# Patient Record
Sex: Male | Born: 2000 | Race: White | Hispanic: Yes | Marital: Single | Smoking: Never smoker
Health system: Southern US, Community
[De-identification: ages and names within clinical notes are randomized; demographics above are authoritative.]

## PROBLEM LIST (undated history)

## (undated) DIAGNOSIS — K2960 Other gastritis without bleeding: Secondary | ICD-10-CM

---
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
## 2019-12-21 ENCOUNTER — Ambulatory Visit (HOSPITAL_COMMUNITY)
Admission: EM | Admit: 2019-12-21 | Discharge: 2019-12-21 | Disposition: A | Discharge status: Home / Self Care | Payer: Medicaid Other | Attending: Family Medicine | Admitting: Family Medicine

## 2019-12-21 ENCOUNTER — Ambulatory Visit (INDEPENDENT_AMBULATORY_CARE_PROVIDER_SITE_OTHER): Payer: Medicaid Other

## 2019-12-21 ENCOUNTER — Other Ambulatory Visit: Payer: Self-pay

## 2019-12-21 ENCOUNTER — Encounter (HOSPITAL_COMMUNITY): Payer: Self-pay

## 2019-12-21 DIAGNOSIS — S2242XA Multiple fractures of ribs, left side, initial encounter for closed fracture: Secondary | ICD-10-CM | POA: Diagnosis not present

## 2019-12-21 DIAGNOSIS — S299XXA Unspecified injury of thorax, initial encounter: Secondary | ICD-10-CM | POA: Diagnosis not present

## 2019-12-21 DIAGNOSIS — R0781 Pleurodynia: Secondary | ICD-10-CM | POA: Diagnosis not present

## 2019-12-21 MED ORDER — TRAMADOL HCL 50 MG PO TABS: 50.0000 mg | ORAL_TABLET | Freq: Four times a day (QID) | ORAL | 0 refills | Status: AC | PRN

## 2019-12-21 NOTE — ED Provider Notes (Signed)
[facility]    CSN: [id number] Arrival date & time: 12/21/19  1504      History   Chief Complaint Chief Complaint  Patient presents with  . Fall    HPI Kevin Rodriguez is a 19 y.o. male.   Presenting today with left anterior rib pain and pain with breathing that has worsened the past week after a mechanical fall onto concrete. Denies bruising or abrasions to the area, wheezing, SOB, swelling to area. Has not tried anything OTC for sxs.      Past Medical History:  Diagnosis Date  . Reflux gastritis     There are no problems to display for this patient.   History reviewed. No pertinent surgical history.     Home Medications    Prior to Admission medications   Medication Sig Start Date End Date Taking? Authorizing Provider  ibuprofen (ADVIL,MOTRIN) 600 MG tablet Take 1 tablet (600 mg total) by mouth every 8 (eight) hours as needed. 11/03/17   Cathie Hoops, Amy V, PA-C  omeprazole (PRILOSEC) 40 MG capsule Take 40 mg by mouth daily.    [provider]  ondansetron (ZOFRAN) 4 MG tablet Take 1 tablet (4 mg total) by mouth every 6 (six) hours. Patient not taking: Reported on 11/03/2017 11/09/15   Alene Mires, NP  ranitidine (ZANTAC) 150 MG capsule Take 150 mg by mouth 2 (two) times daily.    [provider]  traMADol (ULTRAM) 50 MG tablet Take 1 tablet (50 mg total) by mouth every 6 (six) hours as needed. 12/21/19   Particia Nearing, PA-C    Family History Family History  Problem Relation Age of Onset  . Healthy Mother   . Healthy Father     Social History Social History   Tobacco Use  . Smoking status: Never Smoker  . Smokeless tobacco: Never Used  Substance Use Topics  . Alcohol use: No  . Drug use: No     Allergies   Patient has no known allergies.   Review of Systems Review of Systems PER HPI    Physical Exam Triage Vital Signs ED Triage Vitals  Enc Vitals Group     BP 12/21/19 1518 128/73     Pulse  Rate 12/21/19 1518 75     Resp 12/21/19 1518 18     Temp 12/21/19 1518 99.6 F (37.6 C)     Temp src --      SpO2 12/21/19 1518 100 %     Weight --      Height --      Head Circumference --      Peak Flow --      Pain Score 12/21/19 1517 5     Pain Loc --      Pain Edu? --      Excl. in GC? --    No data found.  Updated Vital Signs BP 128/73   Pulse 75   Temp 99.6 F (37.6 C)   Resp 18   SpO2 100%   Visual Acuity Right Eye Distance:   Left Eye Distance:   Bilateral Distance:    Right Eye Near:   Left Eye Near:    Bilateral Near:     Physical Exam Vitals and nursing note reviewed.  Constitutional:      Appearance: Normal appearance.  HENT:     Head: Atraumatic.  Eyes:     Extraocular Movements: Extraocular movements intact.     Conjunctiva/sclera: Conjunctivae normal.  Cardiovascular:     Rate and Rhythm: Normal rate and regular rhythm.  Pulmonary:     Effort: Pulmonary effort is normal.     Breath sounds: Normal breath sounds. No wheezing or rales.     Comments: Chest rise symmetric, good breath sounds throughout Abdominal:     General: Bowel sounds are normal. There is no distension.     Palpations: Abdomen is soft.     Tenderness: There is no abdominal tenderness. There is no guarding.  Musculoskeletal:        General: Tenderness (left anterior ribs ttp ) present. No swelling. Normal range of motion.     Cervical back: Normal range of motion and neck supple.  Skin:    General: Skin is warm and dry.     Findings: No bruising, erythema or lesion.  Neurological:     General: No focal deficit present.     Mental Status: He is oriented to person, place, and time.  Psychiatric:        Mood and Affect: Mood normal.        Thought Content: Thought content normal.        Judgment: Judgment normal.     UC Treatments / Results  Labs (all labs ordered are listed, but only abnormal results are displayed) Labs Reviewed - No data to  display  EKG   Radiology DG Ribs Unilateral W/Chest Left  Result Date: 12/21/2019 CLINICAL DATA:  Left anterior rib pain after fall last week EXAM: LEFT RIBS AND CHEST - 3+ VIEW COMPARISON:  None. FINDINGS: Minimally displaced and angulated fracture of the anterolateral left sixth rib. Slight cortical angulation in subtle lucency likely reflecting a nondisplaced fracture of the adjacent anterolateral left seventh rib as well. Some mild adjacent soft tissue stranding. No other discernible rib fractures are seen. No pneumothorax or visible pleural effusion. No consolidation or edema. The cardiomediastinal contours are unremarkable. Remaining soft tissues are unremarkable IMPRESSION: 1. Minimally displaced fracture of the anterolateral left sixth rib. 2. Probable nondisplaced fracture of the adjacent anterolateral left seventh rib as well. 3. No associated pneumothorax or pleural fluid. Electronically Signed   By: Kreg Shropshire M.D.   On: 12/21/2019 16:14    Procedures Procedures (including critical care time)  Medications Ordered in UC Medications - No data to display  Initial Impression / Assessment and Plan / UC Course  I have reviewed the triage vital signs and the nursing notes.  Pertinent labs & imaging results that were available during my care of the patient were reviewed by me and considered in my medical decision making (see chart for details).      X-ray today showing mildly displaced left 6th rib fracture and possible non displaced 7th rib fracture with no evidence of pneumothorax. Vitals stable, breath sounds full and equal b/l. Will tx with tramadol for severe pain, OTC pain relievers, heat, rest. F/u if sxs worsen at any time.   Final Clinical Impressions(s) / UC Diagnoses   Final diagnoses:  Closed fracture of multiple ribs of left side, initial encounter   Discharge Instructions   None    ED Prescriptions    Medication Sig Dispense Auth. Provider   traMADol (ULTRAM)  50 MG tablet Take 1 tablet (50 mg total) by mouth every 6 (six) hours as needed. 15 tablet Particia Nearing, [place]     I have reviewed the PDMP during this encounter.   Particia Nearing, [place] 12/21/19 1630

## 2019-12-21 NOTE — ED Triage Notes (Signed)
Pt presents with complaints of slipping and falling last week. Complaints of pain in his left rib area. Denies using any otc pain medication at home.

## 2020-05-16 ENCOUNTER — Ambulatory Visit (HOSPITAL_COMMUNITY)
Admission: EM | Admit: 2020-05-16 | Discharge: 2020-05-16 | Disposition: A | Discharge status: Home / Self Care | Payer: Medicaid Other | Attending: Family Medicine | Admitting: Family Medicine

## 2020-05-16 ENCOUNTER — Emergency Department (HOSPITAL_COMMUNITY)
Admission: EM | Admit: 2020-05-16 | Discharge: 2020-05-16 | Disposition: A | Discharge status: Home / Self Care | Payer: Medicaid Other | Attending: Emergency Medicine | Admitting: Emergency Medicine

## 2020-05-16 ENCOUNTER — Emergency Department (HOSPITAL_COMMUNITY): Payer: Medicaid Other

## 2020-05-16 ENCOUNTER — Other Ambulatory Visit: Payer: Self-pay

## 2020-05-16 ENCOUNTER — Encounter (HOSPITAL_COMMUNITY): Payer: Self-pay | Admitting: Emergency Medicine

## 2020-05-16 DIAGNOSIS — R200 Anesthesia of skin: Secondary | ICD-10-CM | POA: Insufficient documentation

## 2020-05-16 DIAGNOSIS — S060X0A Concussion without loss of consciousness, initial encounter: Secondary | ICD-10-CM | POA: Insufficient documentation

## 2020-05-16 DIAGNOSIS — Y9311 Activity, swimming: Secondary | ICD-10-CM | POA: Diagnosis not present

## 2020-05-16 DIAGNOSIS — S0990XA Unspecified injury of head, initial encounter: Secondary | ICD-10-CM | POA: Diagnosis present

## 2020-05-16 DIAGNOSIS — W228XXA Striking against or struck by other objects, initial encounter: Secondary | ICD-10-CM | POA: Diagnosis not present

## 2020-05-16 DIAGNOSIS — Y9234 Swimming pool (public) as the place of occurrence of the external cause: Secondary | ICD-10-CM | POA: Diagnosis not present

## 2020-05-16 LAB — CBC WITH DIFFERENTIAL/PLATELET
Abs Immature Granulocytes: 0.02 10*3/uL (ref 0.00–0.07)
Basophils Absolute: 0.1 10*3/uL (ref 0.0–0.1)
Basophils Relative: 1 %
Eosinophils Absolute: 0.3 10*3/uL (ref 0.0–0.5)
Eosinophils Relative: 3 %
HCT: 42.7 % (ref 39.0–52.0)
Hemoglobin: 14.4 g/dL (ref 13.0–17.0)
Immature Granulocytes: 0 %
Lymphocytes Relative: 34 %
Lymphs Abs: 2.6 10*3/uL (ref 0.7–4.0)
MCH: 29.5 pg (ref 26.0–34.0)
MCHC: 33.7 g/dL (ref 30.0–36.0)
MCV: 87.5 fL (ref 80.0–100.0)
Monocytes Absolute: 0.8 10*3/uL (ref 0.1–1.0)
Monocytes Relative: 10 %
Neutro Abs: 3.9 10*3/uL (ref 1.7–7.7)
Neutrophils Relative %: 52 %
Platelets: 222 10*3/uL (ref 150–400)
RBC: 4.88 MIL/uL (ref 4.22–5.81)
RDW: 12 % (ref 11.5–15.5)
WBC: 7.6 10*3/uL (ref 4.0–10.5)
nRBC: 0 % (ref 0.0–0.2)

## 2020-05-16 LAB — BASIC METABOLIC PANEL
Anion gap: 7 (ref 5–15)
BUN: 10 mg/dL (ref 6–20)
CO2: 25 mmol/L (ref 22–32)
Calcium: 9.1 mg/dL (ref 8.9–10.3)
Chloride: 104 mmol/L (ref 98–111)
Creatinine, Ser: 0.71 mg/dL (ref 0.61–1.24)
GFR, Estimated: >60 mL/min (ref >60)
Glucose, Bld: 105 mg/dL — ABNORMAL HIGH (ref 70–99)
Potassium: 3.6 mmol/L (ref 3.5–5.1)
Sodium: 136 mmol/L (ref 135–145)

## 2020-05-16 NOTE — Discharge Instructions (Signed)
Use tylenol or ibuprofen for the headache.  Drink plenty of water and get lots of rest
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
## 2020-05-16 NOTE — ED Notes (Signed)
Patient verbalizes understanding of discharge instructions. Opportunity for questioning and answers were provided. Armband removed by staff, pt discharged from ED ambulatory.   

## 2020-05-21 NOTE — ED Provider Notes (Signed)
  [facility]   [id number] 05/16/20 Arrival Time: 1928  ASSESSMENT & PLAN:  1. Numbness     Discussed my limited ability to work this up here. He prefers ED evaluation. Stable upon discharge.   Follow-up Information    Go to  [facility] EMERGENCY DEPARTMENT.   Specialty: Emergency Medicine Contact information: [address] [id number] [place] [address] [phone]              Reviewed expectations re: course of current medical issues. Questions answered. Outlined signs and symptoms indicating need for more acute intervention. Patient verbalized understanding. After Visit Summary given.   SUBJECTIVE: History from: patient. Fair historian. Kevin Rodriguez is a 20 y.o. male who reports "body numbness". "My whole body". Occasional headaches and questionable visual changes. Denies alcohol and tobacco and drug use. Ambulatory without difficulty. No head injury. No new medications.    OBJECTIVE:  Vitals:   05/16/20 2006  BP: 120/72  Pulse: 73  Resp: 20  Temp: 99.1 F (37.3 C)  TempSrc: Oral  SpO2: 98%    General appearance: alert; no distress Eyes: PERRLA; EOMI; conjunctiva normal HENT: normocephalic; atraumatic; TMs normal; nasal mucosa normal; oral mucosa normal Neck: supple  Lungs: clear to auscultation bilaterally; unlabored Heart: regular rate and rhythm without murmer Abdomen: soft, non-tender; bowel sounds normal; no masses or organomegaly; no guarding or rebound tenderness Back: no CVA tenderness Extremities: no edema; symmetrical with no gross deformities Skin: warm and dry Neurologic: normal gait; normal symmetric reflexes; CN 2-12 grossly intact Psychological: alert and cooperative; normal mood and affect   No Known Allergies  Past Medical History:  Diagnosis Date  . Reflux gastritis    Social History   Socioeconomic History  . Marital status: Single    Spouse name: Not on file   . Number of children: Not on file  . Years of education: Not on file  . Highest education level: Not on file  Occupational History  . Not on file  Tobacco Use  . Smoking status: Never Smoker  . Smokeless tobacco: Never Used  Vaping Use  . Vaping Use: Never used  Substance and Sexual Activity  . Alcohol use: No  . Drug use: No  . Sexual activity: Not on file  Other Topics Concern  . Not on file  Social History Narrative  . Not on file   Social Determinants of Health   Financial Resource Strain: Not on file  Food Insecurity: Not on file  Transportation Needs: Not on file  Physical Activity: Not on file  Stress: Not on file  Social Connections: Not on file  Intimate Partner Violence: Not on file   Family History  Problem Relation Age of Onset  . Healthy Mother   . Healthy Father    History reviewed. No pertinent surgical history.   Mardella Layman, MD 05/21/20 [phone]

## 2022-07-12 IMAGING — CT CT HEAD W/O CM
4 series · 17 of 47 positions shown, 19 images · non-contrast
Comparison: None.

CLINICAL DATA: Feeling of numbness throughout the body for a week

EXAM:
CT HEAD WITHOUT CONTRAST
TECHNIQUE: Contiguous axial images were obtained from the base of the skull
through the vertex without intravenous contrast.

[Series 3: head wo · axial · 0.40mm/px · z∈[+988,+1108]mm · 7 of 32 slices shown, 9 images]
[im 4/32  brain]
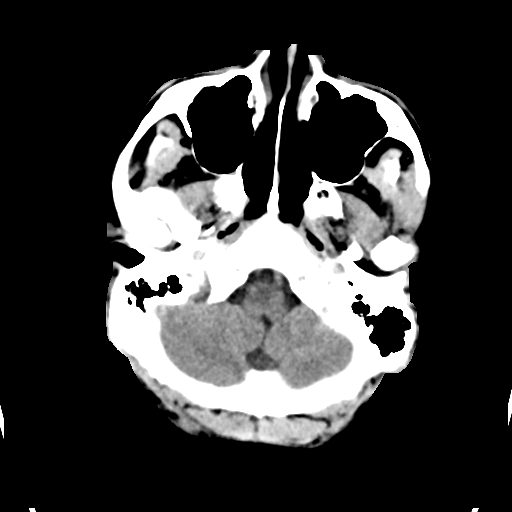
[im 4/32  bone]
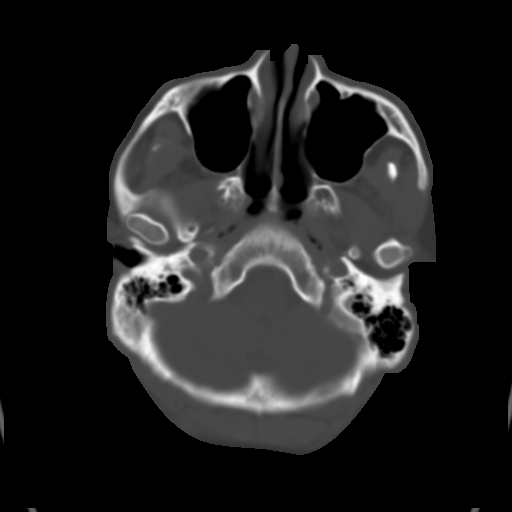
[im 8/32  brain]
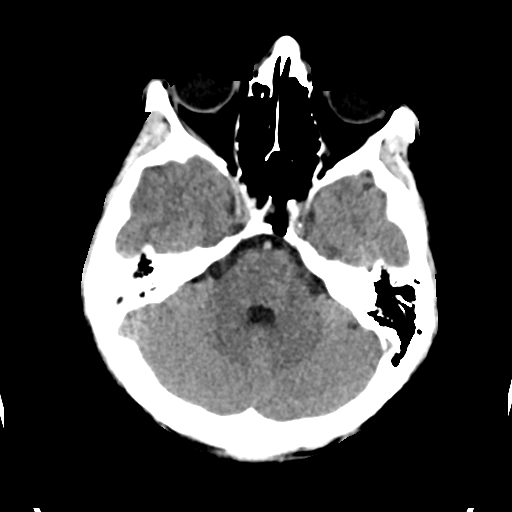
[im 12/32  brain]
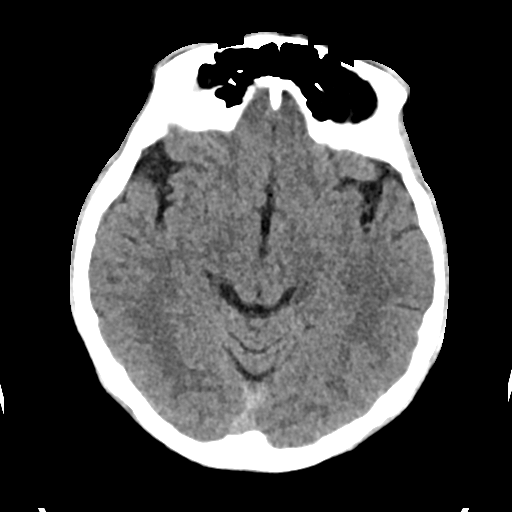
[im 16/32  brain]
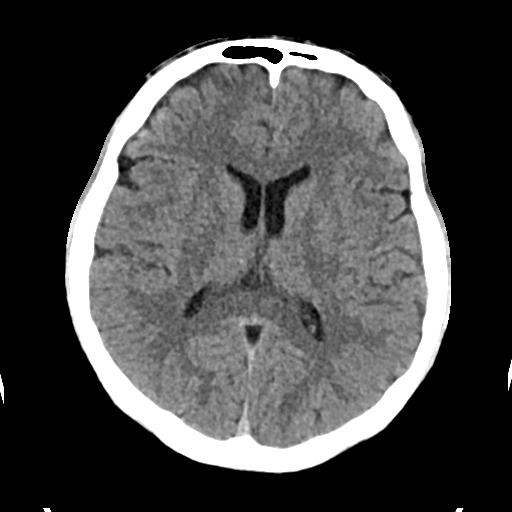
[im 20/32  brain]
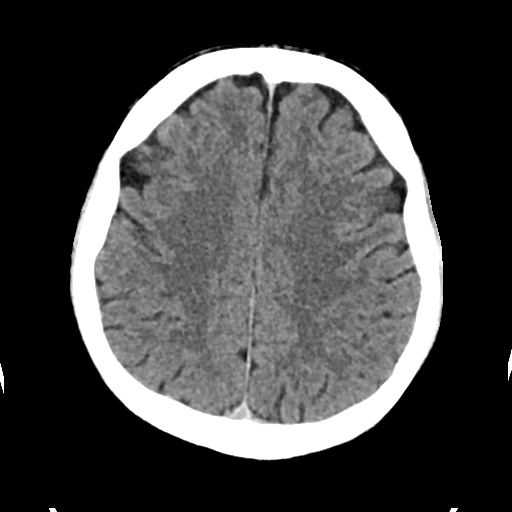
[im 20/32  bone]
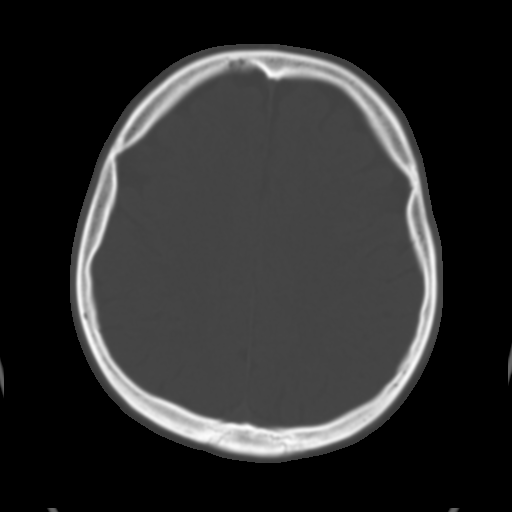
[im 24/32  brain]
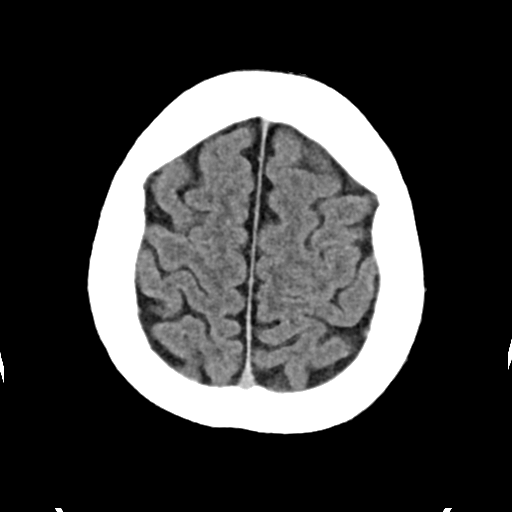
[im 28/32  brain]
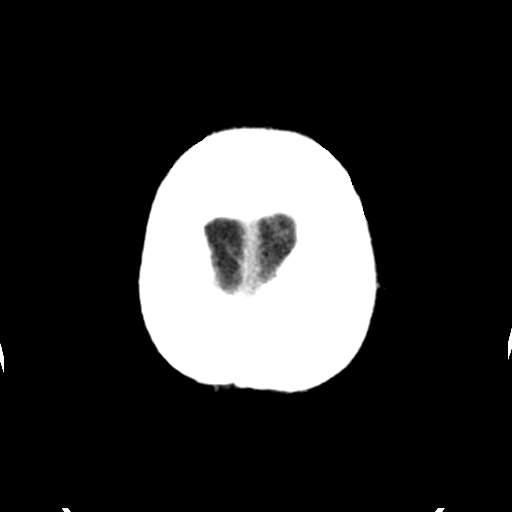

[Series 4: head bone · axial · 0.40mm/px · z∈[+988,+1044]mm · 4 of 80 slices shown]
[im 8/80  bone]
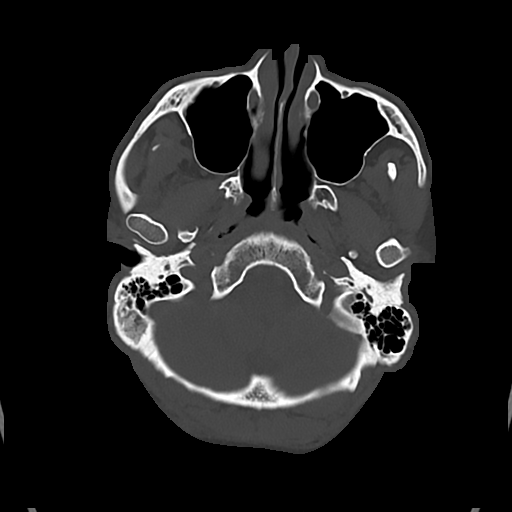
[im 16/80  bone]
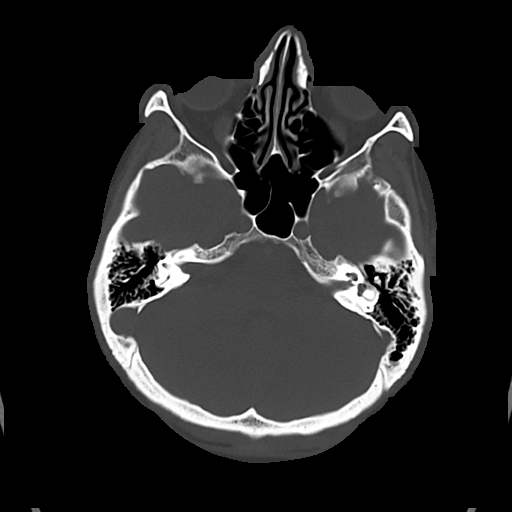
[im 24/80  bone]
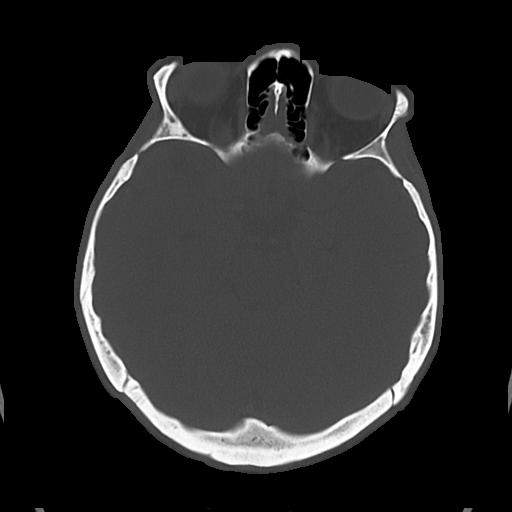
[im 36/80  bone]
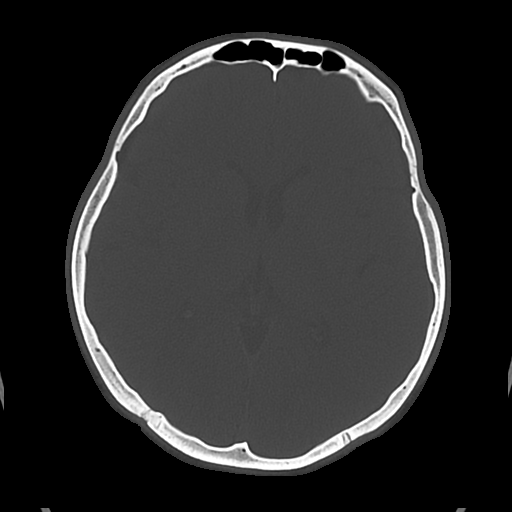

[Series 5: cor soft · coronal · 0.32mm/px · 3 of 66 slices shown]
[im 22/66  brain]
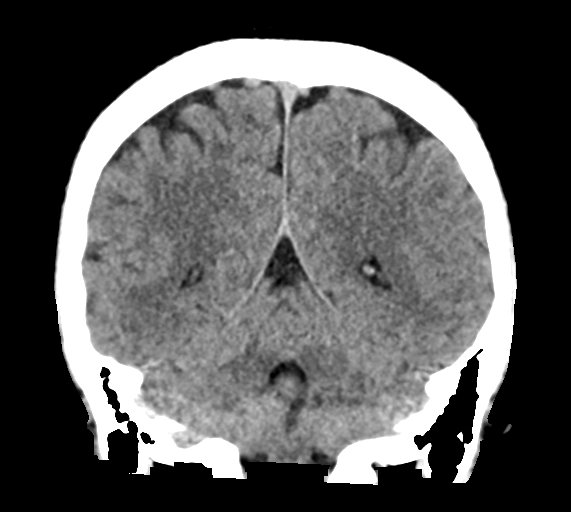
[im 29/66  brain]
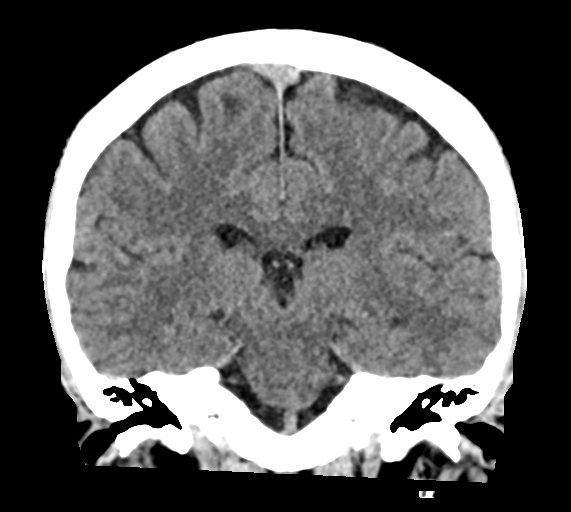
[im 37/66  brain]
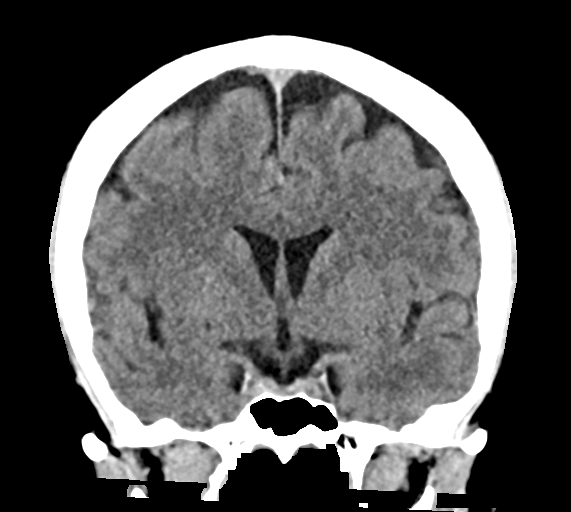

[Series 6: sag soft · sagittal · 0.32mm/px · 3 of 62 slices shown]
[im 21/62  brain]
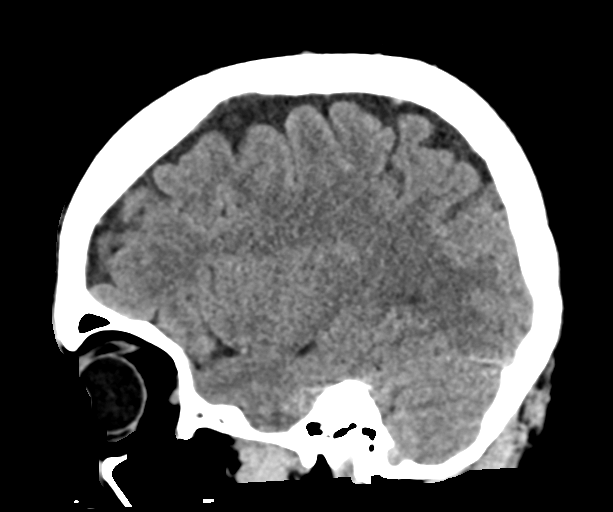
[im 31/62  brain]
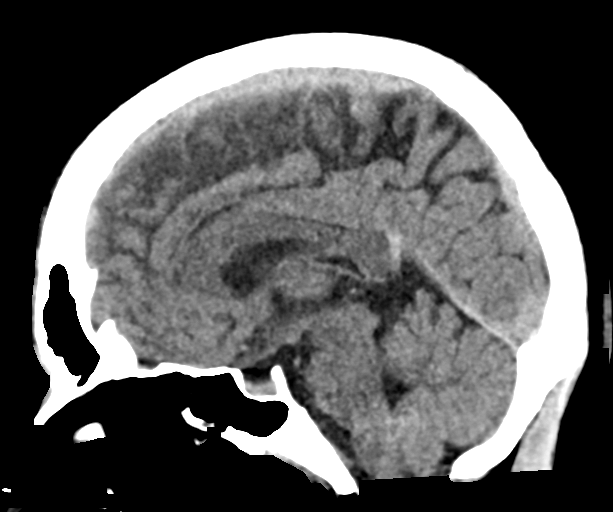
[im 41/62  brain]
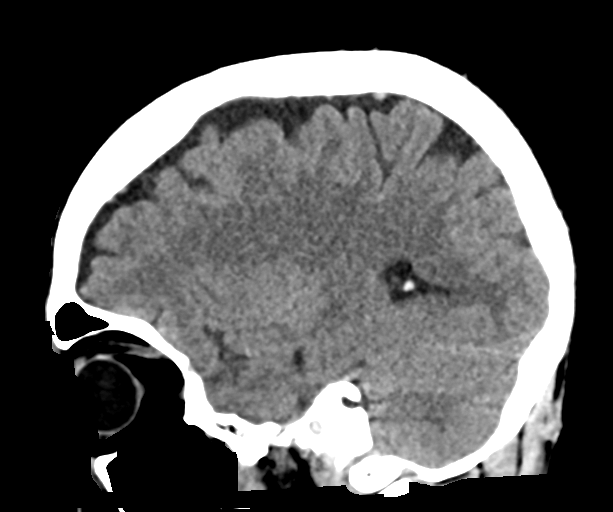

[17 of 47 positions shown; findings below may reference images not displayed]

FINDINGS: Brain: No evidence of acute infarction, hemorrhage, hydrocephalus,
extra-axial collection or mass lesion/mass effect.

Vascular: No hyperdense vessel or unexpected calcification.

Skull: Normal. Negative for fracture or focal lesion.

Sinuses/Orbits: The visualized paranasal sinuses and mastoid air
cells are predominantly clear. Orbits are grossly unremarkable.

Other: None.
IMPRESSION: No acute intracranial abnormality.

## 2023-05-20 ENCOUNTER — Other Ambulatory Visit: Payer: Self-pay

## 2023-05-20 DIAGNOSIS — J029 Acute pharyngitis, unspecified: Secondary | ICD-10-CM | POA: Diagnosis present

## 2023-05-20 DIAGNOSIS — J028 Acute pharyngitis due to other specified organisms: Secondary | ICD-10-CM | POA: Insufficient documentation

## 2023-05-20 DIAGNOSIS — R059 Cough, unspecified: Secondary | ICD-10-CM | POA: Insufficient documentation

## 2023-05-20 DIAGNOSIS — B9789 Other viral agents as the cause of diseases classified elsewhere: Secondary | ICD-10-CM | POA: Diagnosis not present

## 2023-05-20 LAB — RESP PANEL BY RT-PCR (RSV, FLU A&B, COVID)  RVPGX2
Influenza A by PCR: NEGATIVE
Influenza B by PCR: NEGATIVE
Resp Syncytial Virus by PCR: NEGATIVE
SARS Coronavirus 2 by RT PCR: NEGATIVE

## 2023-05-20 LAB — GROUP A STREP BY PCR: Group A Strep by PCR: NOT DETECTED

## 2023-05-20 NOTE — ED Triage Notes (Signed)
 Pt POV reporting sharp pain in throat and chills, last took tylenol 8pm. Denies congestion, CP, SOB.

## 2023-05-21 ENCOUNTER — Emergency Department (HOSPITAL_BASED_OUTPATIENT_CLINIC_OR_DEPARTMENT_OTHER)
Admission: EM | Admit: 2023-05-21 | Discharge: 2023-05-21 | Disposition: A | Discharge status: Home / Self Care | Attending: Emergency Medicine | Admitting: Emergency Medicine

## 2023-05-21 DIAGNOSIS — J029 Acute pharyngitis, unspecified: Secondary | ICD-10-CM

## 2023-05-21 MED ORDER — IBUPROFEN 400 MG PO TABS
600.0000 mg | ORAL_TABLET | Freq: Once | ORAL | Status: AC
  Administered 2023-05-21: 600 mg via ORAL
  Filled 2023-05-21: qty 1

## 2023-05-21 MED ORDER — DEXAMETHASONE SODIUM PHOSPHATE 10 MG/ML IJ SOLN
10.0000 mg | Freq: Once | INTRAMUSCULAR | Status: AC
  Administered 2023-05-21: 10 mg via INTRAMUSCULAR
  Filled 2023-05-21: qty 1

## 2023-05-21 NOTE — ED Provider Notes (Signed)
 [place] EMERGENCY DEPARTMENT AT [facility] Provider Note   CSN: [id number] Arrival date & time: 05/20/23  2221     History  Chief Complaint  Patient presents with   Sore Throat    Kevin Rodriguez is a 23 y.o. male.  HPI     This is a 23 year old male who presents with sore throat.  Patient reports 1 day history of sore throat.  Also states that he feels chilled.  Has not had any documented fevers.  Had a cough last week.  No other known sick contacts.  Took Tylenol prior to arrival.  Home Medications Prior to Admission medications   Medication Sig Start Date End Date Taking? Authorizing Provider  ibuprofen  (ADVIL ,MOTRIN ) 600 MG tablet Take 1 tablet (600 mg total) by mouth every 8 (eight) hours as needed. 11/03/17   Wilhelmenia Harada, Amy V, PA-C  omeprazole (PRILOSEC) 40 MG capsule Take 40 mg by mouth daily.    [provider]  ondansetron  (ZOFRAN ) 4 MG tablet Take 1 tablet (4 mg total) by mouth every 6 (six) hours. Patient not taking: Reported on 11/03/2017 11/09/15   Marceil Sensor, NP  ranitidine (ZANTAC) 150 MG capsule Take 150 mg by mouth 2 (two) times daily.    [provider]  traMADol  (ULTRAM ) 50 MG tablet Take 1 tablet (50 mg total) by mouth every 6 (six) hours as needed. 12/21/19   Corbin Dess, PA-C      Allergies    Patient has no known allergies.    Review of Systems   Review of Systems  Constitutional:  Positive for chills. Negative for fever.  HENT:  Positive for sore throat.   Respiratory:  Positive for cough.   All other systems reviewed and are negative.   Physical Exam Updated Vital Signs BP 122/83   Pulse 95   Temp 99.2 F (37.3 C) (Oral)   Resp 16   Ht 1.702 m (5\' 7" )   Wt 74.8 kg   SpO2 98%   BMI 25.84 kg/m  Physical Exam Vitals and nursing note reviewed.  Constitutional:      Appearance: He is well-developed.  HENT:     Head: Normocephalic and atraumatic.     Nose: No congestion.      Mouth/Throat:     Comments: Posterior oropharynx slightly erythematous, uvula midline, slight tonsillar exudate noted bilaterally, no asymmetric tonsillar swelling Eyes:     Pupils: Pupils are equal, round, and reactive to light.  Cardiovascular:     Rate and Rhythm: Normal rate and regular rhythm.     Heart sounds: Normal heart sounds. No murmur heard. Pulmonary:     Effort: Pulmonary effort is normal. No respiratory distress.     Breath sounds: Normal breath sounds. No wheezing.  Abdominal:     General: Bowel sounds are normal.     Palpations: Abdomen is soft.     Tenderness: There is no abdominal tenderness. There is no rebound.  Musculoskeletal:     Cervical back: Neck supple.  Lymphadenopathy:     Cervical: Cervical adenopathy present.  Skin:    General: Skin is warm and dry.  Neurological:     Mental Status: He is alert and oriented to person, place, and time.  Psychiatric:        Mood and Affect: Mood normal.     ED Results / Procedures / Treatments   Labs (all labs ordered are listed, but only abnormal results are displayed) Labs Reviewed  GROUP  A STREP BY PCR  RESP PANEL BY RT-PCR (RSV, FLU A&B, COVID)  [id number]    EKG None  Radiology No results found.  Procedures Procedures    Medications Ordered in ED Medications  dexamethasone  (DECADRON ) injection 10 mg (10 mg Intramuscular Given 05/21/23 0128)  ibuprofen  (ADVIL ) tablet 600 mg (600 mg Oral Given 05/21/23 0128)    ED Course/ Medical Decision Making/ A&P                                 Medical Decision Making Risk Prescription drug management.   This patient presents to the ED for concern of sore throat, this involves an extensive number of treatment options, and is a complaint that carries with it a high risk of complications and morbidity.  I considered the following differential and admission for this acute, potentially life threatening condition.  The differential diagnosis includes strep, COVID,  influenza, viral pharyngitis, tonsillitis, less likely deep space infection.  MDM:    This is a 23 year old male who presents with sore throat.  He is nontoxic and vital signs are reassuring.  He does have some tonsillar exudate but he is afebrile.  Per [facility] criteria he is 2 out of 4.  Strep testing is negative.  COVID and influenza are negative.  Highly suspect viral etiology.  Recommend NSAIDs.  Patient was given 1 dose of Decadron  for any swelling.  No signs or symptoms of deep space infection such as PTA.  (Labs, imaging, consults)  Labs: I Ordered, and personally interpreted labs.  The pertinent results include: COVID, influenza, strep  Imaging Studies ordered: I ordered imaging studies including none I independently visualized and interpreted imaging. I agree with the radiologist interpretation  Additional history obtained from chart review.  External records from outside source obtained and reviewed including prior evaluations  Cardiac Monitoring: The patient was not maintained on a cardiac monitor.  If on the cardiac monitor, I personally viewed and interpreted the cardiac monitored which showed an underlying rhythm of: N/A  Reevaluation: After the interventions noted above, I reevaluated the patient and found that they have :stayed the same  Social Determinants of Health:  lives independently  Disposition: Discharge  Co morbidities that complicate the patient evaluation  Past Medical History:  Diagnosis Date   Reflux gastritis      Medicines Meds ordered this encounter  Medications   dexamethasone  (DECADRON ) injection 10 mg   ibuprofen  (ADVIL ) tablet 600 mg    I have reviewed the patients home medicines and have made adjustments as needed  Problem List / ED Course: Problem List Items Addressed This Visit   None Visit Diagnoses       Acute viral pharyngitis    -  Primary                   Final Clinical Impression(s) / ED Diagnoses Final  diagnoses:  Acute viral pharyngitis    Rx / DC Orders ED Discharge Orders     None         Rory Collard, MD 05/21/23 [phone]

## 2023-05-21 NOTE — Discharge Instructions (Signed)
 You were seen today for sore throat.  You likely have a virus.  Your COVID and flu testing as well as your strep testing are all negative.  Continue ibuprofen  as needed for any pain or fevers.
